# Patient Record
Sex: Female | Born: 1960 | Hispanic: No | Marital: Married | Smoking: Never smoker
Health system: Southern US, Community
[De-identification: ages and names within clinical notes are randomized; demographics above are authoritative.]

## PROBLEM LIST (undated history)

## (undated) DIAGNOSIS — N939 Abnormal uterine and vaginal bleeding, unspecified: Secondary | ICD-10-CM

## (undated) DIAGNOSIS — Z789 Other specified health status: Secondary | ICD-10-CM

## (undated) DIAGNOSIS — N814 Uterovaginal prolapse, unspecified: Secondary | ICD-10-CM

## (undated) DIAGNOSIS — E785 Hyperlipidemia, unspecified: Secondary | ICD-10-CM

## (undated) HISTORY — DX: Uterovaginal prolapse, unspecified: N81.4

## (undated) HISTORY — DX: Hyperlipidemia, unspecified: E78.5

## (undated) HISTORY — DX: Abnormal uterine and vaginal bleeding, unspecified: N93.9

## (undated) HISTORY — PX: NO PAST SURGERIES: SHX2092

---

## 1995-12-24 HISTORY — PX: TUBAL LIGATION: SHX77

## 2008-12-22 ENCOUNTER — Other Ambulatory Visit: Admission: RE | Admit: 2008-12-22 | Discharge: 2008-12-22 | Payer: Self-pay | Admitting: Gynecology

## 2008-12-22 ENCOUNTER — Ambulatory Visit: Payer: Self-pay | Admitting: Gynecology

## 2008-12-22 ENCOUNTER — Encounter: Payer: Self-pay | Admitting: Gynecology

## 2009-01-01 ENCOUNTER — Ambulatory Visit (HOSPITAL_COMMUNITY): Admission: RE | Admit: 2009-01-01 | Discharge: 2009-01-01 | Payer: Self-pay | Admitting: Gynecology

## 2009-01-11 ENCOUNTER — Encounter: Admission: RE | Admit: 2009-01-11 | Discharge: 2009-01-11 | Payer: Self-pay | Admitting: Gynecology

## 2011-08-01 ENCOUNTER — Emergency Department (HOSPITAL_COMMUNITY)
Admission: EM | Admit: 2011-08-01 | Discharge: 2011-08-01 | Disposition: A | Discharge status: Home / Self Care | Payer: Self-pay | Attending: Emergency Medicine | Admitting: Emergency Medicine

## 2011-08-01 DIAGNOSIS — N949 Unspecified condition associated with female genital organs and menstrual cycle: Secondary | ICD-10-CM | POA: Insufficient documentation

## 2011-08-01 DIAGNOSIS — N814 Uterovaginal prolapse, unspecified: Secondary | ICD-10-CM | POA: Insufficient documentation

## 2011-08-01 LAB — URINALYSIS, ROUTINE W REFLEX MICROSCOPIC
Glucose, UA: NEGATIVE mg/dL
Hgb urine dipstick: NEGATIVE
Leukocytes, UA: NEGATIVE
Protein, ur: NEGATIVE mg/dL
Specific Gravity, Urine: 1.008 (ref 1.005–1.030)
pH: 6.5 (ref 5.0–8.0)

## 2011-11-11 ENCOUNTER — Encounter: Payer: Self-pay | Admitting: Obstetrics and Gynecology

## 2015-03-03 ENCOUNTER — Encounter (HOSPITAL_COMMUNITY): Payer: Self-pay | Admitting: *Deleted

## 2015-03-03 ENCOUNTER — Inpatient Hospital Stay (HOSPITAL_COMMUNITY)
Admission: AD | Admit: 2015-03-03 | Discharge: 2015-03-03 | Disposition: A | Discharge status: Home / Self Care | Payer: BLUE CROSS/BLUE SHIELD | Source: Ambulatory Visit | Attending: Family Medicine | Admitting: Family Medicine

## 2015-03-03 ENCOUNTER — Inpatient Hospital Stay (HOSPITAL_COMMUNITY): Payer: BLUE CROSS/BLUE SHIELD

## 2015-03-03 DIAGNOSIS — N814 Uterovaginal prolapse, unspecified: Secondary | ICD-10-CM | POA: Diagnosis not present

## 2015-03-03 DIAGNOSIS — N854 Malposition of uterus: Secondary | ICD-10-CM | POA: Insufficient documentation

## 2015-03-03 DIAGNOSIS — N939 Abnormal uterine and vaginal bleeding, unspecified: Secondary | ICD-10-CM | POA: Diagnosis present

## 2015-03-03 DIAGNOSIS — N819 Female genital prolapse, unspecified: Secondary | ICD-10-CM

## 2015-03-03 HISTORY — DX: Other specified health status: Z78.9

## 2015-03-03 LAB — POCT PREGNANCY, URINE: Preg Test, Ur: NEGATIVE

## 2015-03-03 LAB — CBC
HCT: 41.1 % (ref 36.0–46.0)
Hemoglobin: 13.8 g/dL (ref 12.0–15.0)
MCH: 28.8 pg (ref 26.0–34.0)
MCHC: 33.6 g/dL (ref 30.0–36.0)
MCV: 85.6 fL (ref 78.0–100.0)
Platelets: 328 10*3/uL (ref 150–400)
RBC: 4.8 MIL/uL (ref 3.87–5.11)
RDW: 13.9 % (ref 11.5–15.5)
WBC: 8.4 10*3/uL (ref 4.0–10.5)

## 2015-03-03 LAB — WET PREP, GENITAL
Clue Cells Wet Prep HPF POC: NONE SEEN
Trich, Wet Prep: NONE SEEN
Yeast Wet Prep HPF POC: NONE SEEN

## 2015-03-03 NOTE — MAU Note (Signed)
Urine in lab 

## 2015-03-03 NOTE — MAU Note (Signed)
Pt was told 2 years ago that her uterus had "dropped" & was putting pressure on her bladder.  Has not had a period in over a year.  Has been bleeding for the past six months, came to hospital today because it has a foul odor.  Changes a pad at least three times a day.  Denies pain.

## 2015-03-03 NOTE — Discharge Instructions (Signed)
Colpocleisis (Colpocleisis) La colpocleisis (colpectoma) es un procedimiento quirrgico para eliminar de manera parcial o completa, y cerrar (suturar) la vagina, incluyendo su abertura. Una razn para la ciruga es ayudar a tratar los problemas causados por el prolapso (cada) de uno o ms rganos. El prolapso puede incluir el tero, la vejiga o el recto y generalmente se produce al dar a luz un beb, por realizar grandes esfuerzos o levantar objetos pesados durante largos perodos de Camargotiempo, por ciruga plvica anterior, obesidad, estreimiento crnico y por envejecimiento. Se puede realizar la colpocleisis en mujeres que:  Han dejado de [place] Springsmenstruar.  Le han extirpado el tero (histerectoma).  No desean Management consultanttener relaciones sexuales.  Tienen problemas mdicos que podran hacer que una ciruga ms avanzada fuera Avalonmuy riesgosa. INFORME A SU MDICO:   Cualquier alergia que tenga.  Todos los Chesapeake Energymedicamentos que Huntleyutiliza, incluyendo vitaminas, hierbas, gotas oftlmicas, cremas y [address] Stmedicamentos de [address]venta libre.  Problemas previos que usted o los Graybar Electricmiembros de su familia hayan tenido con el uso de anestsicos.  Enfermedades de [employer] Communicationsla sangre.  Cirugas previas.  Padecimientos mdicos.  Si tuvo resfros o infecciones recientes. RIESGOS Y COMPLICACIONES  Generalmente es un procedimiento seguro. Sin embargo, Tree surgeoncomo en cualquier procedimiento, pueden surgir complicaciones. Las complicaciones posibles son:  IT consultantLesiones en los rganos plvicos circundantes.  Hemorragias.  Infeccin.  Cogulos sanguneos en las piernas o los pulmones.  Problemas con la anestesia. ANTES DEL PROCEDIMIENTO   Consulte a su mdico si debe cambiar o suspender los medicamentos que toma habitualmente.  No debe comer ni beber nada durante las 6 - 8 horas previas al examen. PROCEDIMIENTO   Se le colocarn un catter intravenoso en una vena. Podrn administrarle uno de los siguientes medicamentos:  Medicamentos que adormecen el rea  (anestesia local).  Un medicamento que la har dormir (anestesia general).  Un medicamento inyectado en la columna que adormece el cuerpo de la cintura hacia abajo (anestesia espinal).  Los rganos que protruden vuelven a ubicarse en su posicin normal.  Se elimina la parte superior de la vagina a travs de la abertura de la vagina.  La abertura de la vagina se cierra utilizando suturas absorbibles. Se disolvern en 1-2 meses. DESPUS DEL PROCEDIMIENTO   La llevarn a una sala de recuperacin hasta que la presin arterial, pulso, respiracin y temperatura (signos vitales) estn bien. Luego la trasladarn a una habitacin comn en el hospital.  Duanne Moronodava tendr colocada una va intravenosa en la vena durante 2 das. Tambin tendr un catter en la vejiga durante 2 das.  Le podrn dar antibiticos para prevenir infecciones.  Le indicarn un analgsico. Document Released: 09/02/2012 Document Revised: 08/11/2013 [facility] Patient Information 2015 [place]sserExitCare, [place]. This information is not intended to replace advice given to you by your health care provider. Make sure you discuss any questions you have with your health care provider.

## 2015-03-03 NOTE — MAU Provider Note (Signed)
History     CSN: [id number]  Arrival date and time: 03/03/15 1108   First Provider Initiated Contact with Patient 03/03/15 1217      Chief Complaint  Patient presents with  . Vaginal Bleeding   HPI  54 y.o. G3P3 presents to the MAU today with complaints of shewas told 2 years ago that her uterus had "dropped" & was putting pressure on her bladder. Has not had a period in over a year. Has been bleeding for the past six months, came to hospital today because it has a foul odor.[facility] and was told to be seen at the clinic for further evaluation. She has never followed up. She does have an appointment scheduled April 08, 2015 at the clinic. Changes a pad at least three times a day. Denies pain.  Past Medical History  Diagnosis Date  . Medical history non-contributory     Past Surgical History  Procedure Laterality Date  . No past surgeries      History reviewed. No pertinent family history.  History  Substance Use Topics  . Smoking status: Never Smoker   . Smokeless tobacco: Not on file  . Alcohol Use: No    Allergies: No Known Allergies  No prescriptions prior to admission    Review of Systems  Constitutional: Negative for fever, chills and weight loss.  Eyes: Negative.   Respiratory: Negative for cough and shortness of breath.   Cardiovascular: Negative for chest pain.  Gastrointestinal: Negative for heartburn, nausea, vomiting, abdominal pain, diarrhea and constipation.  Genitourinary: Negative.        Vaginal bleeding for 6 months  Musculoskeletal: Negative.   Skin: Negative for itching and rash.  Neurological: Negative.  Negative for headaches.  Endo/Heme/Allergies: Negative.   Psychiatric/Behavioral: Negative.    Physical Exam   Blood pressure 148/75, pulse 64, temperature 97.6 F (36.4 C), temperature source Oral, resp. rate 18.  Physical Exam  Constitutional: She is oriented to person, place, and time. She appears  well-developed and well-nourished.  HENT:  Head: Normocephalic and atraumatic.  Neck: Normal range of motion.  Cardiovascular: Normal rate.   Respiratory: Effort normal. No respiratory distress.  GI: Soft. She exhibits no distension and no mass. There is no tenderness. There is no rebound and no guarding.  Genitourinary:  Prolapsed uterus with unidentifiable cervix.  Musculoskeletal: Normal range of motion.  Neurological: She is alert and oriented to person, place, and time.  Psychiatric: She has a normal mood and affect. Her behavior is normal. Judgment and thought content normal.    MAU Course  Procedures  MDM Pelvic Exam UA Ultrasound Wet Prep GC/Chlamydia Results for orders placed or performed during the hospital encounter of 03/03/15 (from the past 24 hour(s))  Pregnancy, urine POC     Status: None   Collection Time: 03/03/15 11:51 AM  Result Value Ref Range   Preg Test, Ur NEGATIVE NEGATIVE  Wet prep, genital     Status: Abnormal   Collection Time: 03/03/15 12:50 PM  Result Value Ref Range   Yeast Wet Prep HPF POC NONE SEEN NONE SEEN   Trich, Wet Prep NONE SEEN NONE SEEN   Clue Cells Wet Prep HPF POC NONE SEEN NONE SEEN   WBC, Wet Prep HPF POC MODERATE (A) NONE SEEN  CBC     Status: None   Collection Time: 03/03/15 12:55 PM  Result Value Ref Range   WBC 8.4 4.0 - 10.5 K/uL   RBC 4.80  3.87 - 5.11 MIL/uL   Hemoglobin 13.8 12.0 - 15.0 g/dL   HCT 40.9 81.1 - 91.4 %   MCV 85.6 78.0 - 100.0 fL   MCH 28.8 26.0 - 34.0 pg   MCHC 33.6 30.0 - 36.0 g/dL   RDW 78.2 95.6 - 21.3 %   Platelets 328 150 - 400 K/uL   US Transvaginal Non-ob  03/03/2015   CLINICAL DATA:  Vaginal bleeding.  Prolapsed uterus.  EXAM: TRANSABDOMINAL AND TRANSVAGINAL ULTRASOUND OF PELVIS  TECHNIQUE: Both transabdominal and transvaginal ultrasound examinations of the pelvis were performed. Transabdominal technique was performed for global imaging of the pelvis including uterus, ovaries, adnexal regions,  and pelvic cul-de-sac. It was necessary to proceed with endovaginal exam following the transabdominal exam to visualize the uterus and ovaries.  COMPARISON:  None  FINDINGS: Uterus  Measurements: 5.7 x 2.9 x 3.6 cm. No mass lesion identified. The uterus is difficult to evaluate due to prolapse and retroversion of the uterus.  Endometrium  Thickness: Small amount of fluid in the uterine cavity. No endometrial mass. Endometrium in the uterus not optimally visualized due to anatomic considerations and prolapse. No focal abnormality visualized.  Right ovary  Measurements: . Not visualized  Left ovary  Measurements: . Not visualized.  Other findings  No free fluid.  IMPRESSION: Technically difficult scan due to prolapse of the uterus and body habitus.  No uterine mass lesion.  No endometrial mass  Ovaries not visualized bilaterally.   Electronically Signed   By: Marlan Palau M.D.   On: 03/03/2015 14:23   US Pelvis Complete  03/03/2015   CLINICAL DATA:  Vaginal bleeding.  Prolapsed uterus.  EXAM: TRANSABDOMINAL AND TRANSVAGINAL ULTRASOUND OF PELVIS  TECHNIQUE: Both transabdominal and transvaginal ultrasound examinations of the pelvis were performed. Transabdominal technique was performed for global imaging of the pelvis including uterus, ovaries, adnexal regions, and pelvic cul-de-sac. It was necessary to proceed with endovaginal exam following the transabdominal exam to visualize the uterus and ovaries.  COMPARISON:  None  FINDINGS: Uterus  Measurements: 5.7 x 2.9 x 3.6 cm. No mass lesion identified. The uterus is difficult to evaluate due to prolapse and retroversion of the uterus.  Endometrium  Thickness: Small amount of fluid in the uterine cavity. No endometrial mass. Endometrium in the uterus not optimally visualized due to anatomic considerations and prolapse. No focal abnormality visualized.  Right ovary  Measurements: . Not visualized  Left ovary  Measurements: . Not visualized.  Other findings  No free  fluid.  IMPRESSION: Technically difficult scan due to prolapse of the uterus and body habitus.  No uterine mass lesion.  No endometrial mass  Ovaries not visualized bilaterally.   Electronically Signed   By: Marlan Palau M.D.   On: 03/03/2015 14:23    Assessment and Plan  Uterine Prolapse  Follow up with Scheduled appointment at [facility] on April 08, 2015 Discharge to home   [facility] 03/03/2015, 12:33 PM

## 2015-03-04 LAB — HIV ANTIBODY (ROUTINE TESTING W REFLEX): HIV Screen 4th Generation wRfx: NONREACTIVE

## 2015-03-06 LAB — GC/CHLAMYDIA PROBE AMP (~~LOC~~) NOT AT ARMC
Chlamydia: NEGATIVE
Neisseria Gonorrhea: NEGATIVE

## 2015-04-05 ENCOUNTER — Ambulatory Visit (INDEPENDENT_AMBULATORY_CARE_PROVIDER_SITE_OTHER): Payer: BLUE CROSS/BLUE SHIELD | Admitting: Obstetrics & Gynecology

## 2015-04-05 ENCOUNTER — Encounter: Payer: Self-pay | Admitting: Obstetrics & Gynecology

## 2015-04-05 VITALS — BP 134/53 | HR 69 | Temp 98.2°F | Resp 20 | Ht 63.0 in | Wt 135.1 lb

## 2015-04-05 DIAGNOSIS — N950 Postmenopausal bleeding: Secondary | ICD-10-CM | POA: Insufficient documentation

## 2015-04-05 NOTE — Progress Notes (Signed)
Pt states she received pessary 3 years ago for prolapsed uterus. She was not instructed to remove it periodically for cleaning. She began having vaginal bleeding 6 months ago.

## 2015-04-05 NOTE — Assessment & Plan Note (Signed)
Pertinent S&O  Vaginal bleeding x 6 months s/p menopause ~ 4 years ago  Pessary left in place x 3 years w/o removal - removed in clinic  Posterior vaginal walls very erythematous and macerated   No additional medical problems  No Fhx of CA; No constitutional symptoms concerning for CA Assessment  Bleeding likely due to Vaginal trauma due to prolonged pessary use; However can't ruleout other vaginal pathology or endometrial CA given current condition of vaginal mucosal lining  Plan  Removed Pessary in clinic  Allow 2 weeks for vaginal healing to occur; f/u in clinic in 2 weeks for further evaluation  Consider endometrial biopsy at next visit  Asked patient to clean pessary in bleach water and bring to next visit; DON'T replace pessary until re-evaluation

## 2015-04-05 NOTE — Progress Notes (Signed)
Subjective:     Patient ID: Becky Petty, female   DOB: [birth year], 54 y.o.   MRN: [id number]  HPI Comments: Comes in today for post-menopausal vaginal bleeding x 6 months and vaginal odor. She reports Going through menopause ~ 4 years ago. She started having periods ~ age 54 and they were regular until menopause. She reports having 3 children via Csection and had BTL. She also reports having pessary placed ~ 3 years ago, and has not removed it since. She denies any vaginal pain. Denies Fhx of breast, colon, endometrial or ovarian CA. She has no other medical problems and doesn't take any medications. Denies fevers, chills, night sweats, or weight loss.     Review of Systems  Constitutional: Negative for fever, chills, appetite change and unexpected weight change.  Gastrointestinal: Negative for abdominal pain, constipation and abdominal distention.  Genitourinary: Positive for vaginal bleeding. Negative for vaginal discharge and vaginal pain.      Objective:   Physical Exam  Constitutional: She appears well-developed and well-nourished. No distress.  Cardiovascular: Normal rate, regular rhythm and normal heart sounds.   No murmur heard. Pulmonary/Chest: Effort normal and breath sounds normal. No respiratory distress.  Abdominal: Soft. She exhibits no distension. There is no tenderness.  Genitourinary:  Pessary removed. Posterior vaginal walls extremely erythematous and macerated.    Assessment/Plan:      See Problem Focused Assessment & Plan

## 2015-04-07 ENCOUNTER — Encounter: Payer: Self-pay | Admitting: Obstetrics & Gynecology

## 2015-04-07 NOTE — Progress Notes (Signed)
Patient ID: Becky GoltzOlga Petty, female   DOB: 05/28/1961, 54 y.o.   MRN: [id number] Attestation of Attending Supervision of Resident: Evaluation and management procedures were performed [facility] the [facility] under my supervision.  I have seen and examined the patient, reviewed the resident's note and chart, and I agree with the management and plan.  Pt with pessary for 3 year with no removal. Pt now c/o bleeding and odor.  Exam: pessay was removed without difficulty.  The exam revealed a macerated vagina with bleeding and odor.   It was difficult to appreciate the tissue planes due to edema, erythema and blood.  I have recommended that pt f/u after 2 weeks to allow the swelling to resolve.  I have no evidence of infection so will hold atbx for now and allow time for the tissue to heal spontaneously.     Anibal Hendersonarolyn L Harraway-Smith, M.D. 04/07/2015 10:05 AM

## 2015-04-26 ENCOUNTER — Ambulatory Visit: Payer: Self-pay | Admitting: Obstetrics & Gynecology

## 2015-05-10 ENCOUNTER — Encounter: Payer: Self-pay | Admitting: Obstetrics & Gynecology

## 2015-05-10 ENCOUNTER — Other Ambulatory Visit (HOSPITAL_COMMUNITY)
Admission: RE | Admit: 2015-05-10 | Discharge: 2015-05-10 | Disposition: A | Discharge status: Home / Self Care | Payer: BLUE CROSS/BLUE SHIELD | Source: Ambulatory Visit | Attending: Obstetrics & Gynecology | Admitting: Obstetrics & Gynecology

## 2015-05-10 ENCOUNTER — Ambulatory Visit (INDEPENDENT_AMBULATORY_CARE_PROVIDER_SITE_OTHER): Payer: BLUE CROSS/BLUE SHIELD | Admitting: Obstetrics & Gynecology

## 2015-05-10 VITALS — BP 118/97 | HR 80 | Temp 98.3°F | Wt 134.2 lb

## 2015-05-10 DIAGNOSIS — N950 Postmenopausal bleeding: Secondary | ICD-10-CM | POA: Diagnosis not present

## 2015-05-10 DIAGNOSIS — N841 Polyp of cervix uteri: Secondary | ICD-10-CM | POA: Diagnosis not present

## 2015-05-10 DIAGNOSIS — Z1151 Encounter for screening for human papillomavirus (HPV): Secondary | ICD-10-CM | POA: Diagnosis not present

## 2015-05-10 DIAGNOSIS — Z124 Encounter for screening for malignant neoplasm of cervix: Secondary | ICD-10-CM | POA: Diagnosis not present

## 2015-05-10 NOTE — Progress Notes (Signed)
Subjective:     Patient ID: Becky GoltzOlga Sarchet, female   DOB: [birth year], 54 y.o.   MRN: [id number]  HPI Pt was seen 1 month prev for 'post menopausal bleeding'.  At the time she had a pessary removed that had been in place for 3 years with no removal.  Her vagina was completely macerated and an exam was limited due to the extreme amount of edema.   She is here for reeval.  Pt reports that 2 weeks after she was seen her she had a 'gush of blood' while cleaning.    She reports 2 episodes of bleeding but her husband thinks its more.  The pt says that it is now just spotting.   Review of Systems     Objective:   Physical Exam BP 118/97 mmHg  Pulse 80  Temp(Src) 98.3 F (36.8 C)  Wt 134 lb 3.2 oz (60.873 kg)  LMP  (LMP Unknown) Pt in NAD  The indications for endometrial biopsy were reviewed.   Risks of the biopsy including cramping, bleeding, infection, uterine perforation, inadequate specimen and need for additional procedures  were discussed. The patient states she understands and agrees to undergo procedure today. Consent was signed. Time out was performed. Urine HCG was negative. The pt has a complete procidentia.  The cervix was prepped with Betadine. A polyp was noted at 3 o'clock.  It was removed with Ringed forceps. The 3 mm pipelle was introduced into the endometrial cavity without difficulty to a depth of 9cm, and a moderate amount of tissue was obtained and sent to pathology. The instruments were removed from the patient's vagina. Minimal bleeding from the cervix was noted. The patient tolerated the procedure well.       Assessment:     PMPB- unsure of etiology of the bleeding at present cold be from the pessary Cervical polyp- removed Complete procidentia vaginal walls now healed but, will wait for path prior to replacing pessary       Plan:     Routine post-procedure instructions were given to the patient. The patient will follow up to review the results and for further management.    F/u Endo biopsy F/u cervical polyp path F/u in 2 weeks will review path and replace pessary at that time

## 2015-05-10 NOTE — Addendum Note (Signed)
Addended by: Louanna RawAMPBELL, Olander Friedl M on: 05/10/2015 03:46 PM   Modules accepted: Orders

## 2015-05-10 NOTE — Patient Instructions (Signed)
Biopsia de endometrio - Cuidados posteriores (Endometrial Biopsy, Care After) Siga estas instrucciones durante las prximas semanas. Estas indicaciones le proporcionan informacin general acerca de cmo deber cuidarse despus del procedimiento. El mdico tambin podr darle instrucciones ms especficas. El tratamiento se ha planificado de acuerdo a las prcticas mdicas actuales, pero a veces se producen problemas. Comunquese con el mdico si tiene algn problema o tiene dudas despus del procedimiento. QU ESPERAR DESPUS DEL PROCEDIMIENTO Despus del procedimiento, es tpico tener las siguientes sensaciones:  Sentir clicos leves y tendr una pequea cantidad de sangrado vaginal durante algunos das despus del procedimiento. Esto es normal. INSTRUCCIONES PARA EL CUIDADO EN EL HOGAR  Tome slo medicamentos de venta libre o recetados, segn las indicaciones del mdico.  No utilice tampones, duchas vaginales ni tenga relaciones sexuales hasta que el profesional la autorice.  Siga las indicaciones del mdico relacionadas con la restriccin a ciertas actividades, como ejercicios fsicos intensos o levantar objetos pesados. SOLICITE ATENCIN MDICA SI:  Tiene un sangrado abundante o sangra durante ms de 2 das despus del procedimiento.  Advierte un olor ftido que proviene de la vagina.  Siente escalofros o tiene fiebre.  Siente un dolor en el bajo vientre (abdominal) muy intenso. SOLICITE ATENCIN MDICA DE INMEDIATO SI:  Siente clicos intensos en el estmago o en la espalda.  Elimina cogulos grandes.  La hemorragia aumenta.  Se siente mareada, dbil, o se desmaya. Document Released: 09/29/2013 ExitCare Patient Information 2015 ExitCare, LLC. This information is not intended to replace advice given to you by your health care provider. Make sure you discuss any questions you have with your health care provider.  

## 2015-05-11 LAB — CYTOLOGY - PAP

## 2015-05-15 ENCOUNTER — Telehealth: Payer: Self-pay | Admitting: *Deleted

## 2015-05-15 NOTE — Telephone Encounter (Signed)
-----   Message from Willodean Rosenthalarolyn Harraway-Smith, MD sent at 05/13/2015  4:25 PM EDT ----- Please call pt.  Her endo bx and polyp were both WITHOUT cancer.  She can com ein and have her pessary replaced.  Need spanish interpreter.  Thx, clh-S

## 2015-05-15 NOTE — Telephone Encounter (Signed)
Attempted to contact patient, no answer, unable to leave a message.

## 2015-05-16 NOTE — Telephone Encounter (Signed)
Called Becky Petty with [employer]pre[name], heard message voicemail not set up. Unable to leave message.

## 2015-05-17 ENCOUNTER — Encounter: Payer: Self-pay | Admitting: *Deleted

## 2015-05-17 NOTE — Telephone Encounter (Signed)
[facility], no answer and voicemail has not been set up. Called patient at emergency contact and phone number isn't a working number. Called patient's son, no answer- Left message asking to have patient call us back at the clinics.

## 2015-05-17 NOTE — Telephone Encounter (Signed)
Will send letter.  Certified letter sent.

## 2015-06-02 ENCOUNTER — Ambulatory Visit: Payer: Self-pay | Admitting: Obstetrics & Gynecology

## 2015-06-06 ENCOUNTER — Encounter: Payer: Self-pay | Admitting: General Practice

## 2015-06-07 ENCOUNTER — Encounter: Payer: Self-pay | Admitting: Obstetrics & Gynecology

## 2015-06-07 ENCOUNTER — Ambulatory Visit (INDEPENDENT_AMBULATORY_CARE_PROVIDER_SITE_OTHER): Payer: BLUE CROSS/BLUE SHIELD | Admitting: Obstetrics & Gynecology

## 2015-06-07 VITALS — BP 139/68 | HR 96 | Temp 98.4°F | Wt 134.4 lb

## 2015-06-07 DIAGNOSIS — N813 Complete uterovaginal prolapse: Secondary | ICD-10-CM | POA: Insufficient documentation

## 2015-06-07 NOTE — Progress Notes (Signed)
   Subjective:    Patient ID: Becky Petty, female    DOB: 02-25-1961, 54 y.o.   MRN: [id number]  HPI 54 yo H P3 here for a pessary fitting. She used a large ring with diaphragm for 3 years without removing it at all. She was seen here for PMB and a macerated vaginal was noted. Her pap and EMBX were normal. She tells me that no one ever told her that she should ever remove it.  Review of Systems     Objective:   Physical Exam Complete procedentia noted. Intact vaginal mucosa I placed a #4 pessary (2 3/4 inches) and this worked as well as the larger one. She was able to remove and replace it.      Assessment & Plan:  Complete procedentia- #4 pessary with strict instructions to leave it out every Sunday night. She will have a consult with Dr. Lavella Hammock for possible surgery

## 2015-06-07 NOTE — Progress Notes (Signed)
Referral made to Dr. Lavella Hammock. Appointment scheduled for July 27 at 1300. Patient given appointment, address and phone number. Records to be faxed by front office.

## 2015-09-06 ENCOUNTER — Encounter: Payer: Self-pay | Admitting: Obstetrics & Gynecology

## 2016-12-08 IMAGING — US US TRANSVAGINAL NON-OB
1 series · 14 of 25 positions shown · non-contrast
Comparison: None

CLINICAL DATA: Vaginal bleeding.  Prolapsed uterus.

EXAM:
TRANSABDOMINAL AND TRANSVAGINAL ULTRASOUND OF PELVIS
TECHNIQUE: Both transabdominal and transvaginal ultrasound examinations of the
pelvis were performed. Transabdominal technique was performed for
global imaging of the pelvis including uterus, ovaries, adnexal
regions, and pelvic cul-de-sac. It was necessary to proceed with
endovaginal exam following the transabdominal exam to visualize the
uterus and ovaries.

[Series 1: us pelvis complete · 14 of 43 slices shown]
[im 1/43]
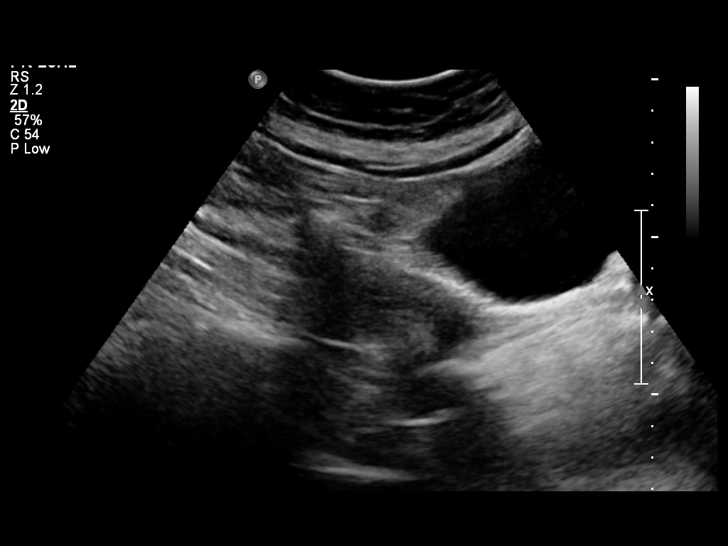
[im 4/43]
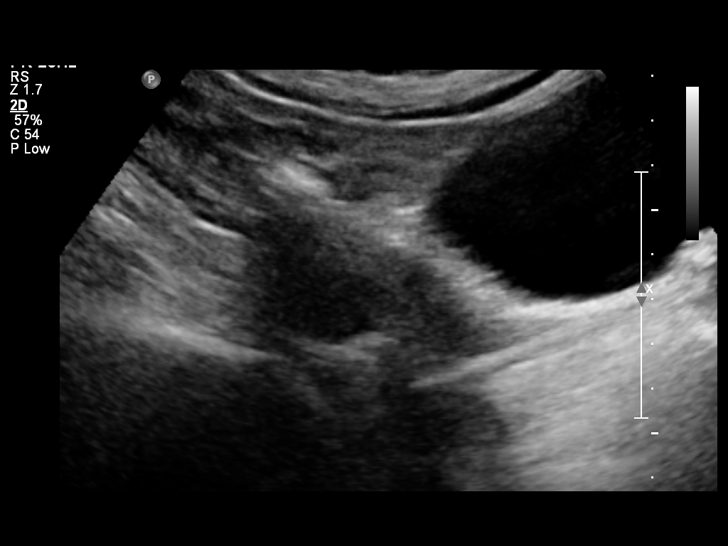
[im 8/43]
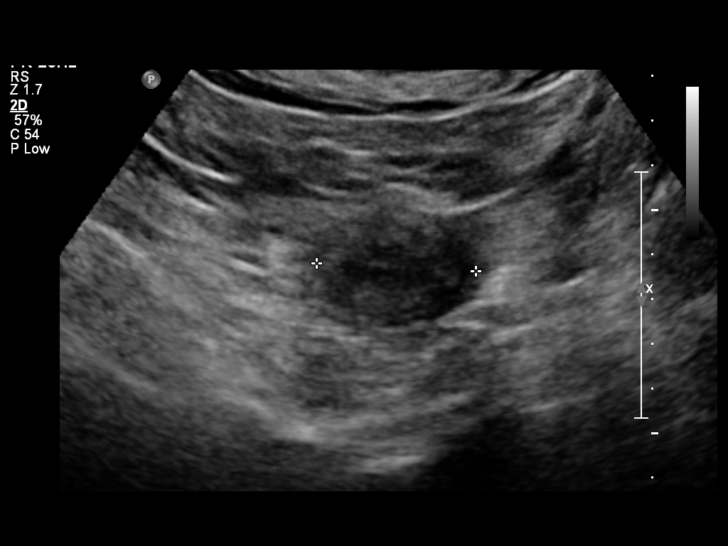
[im 11/43]
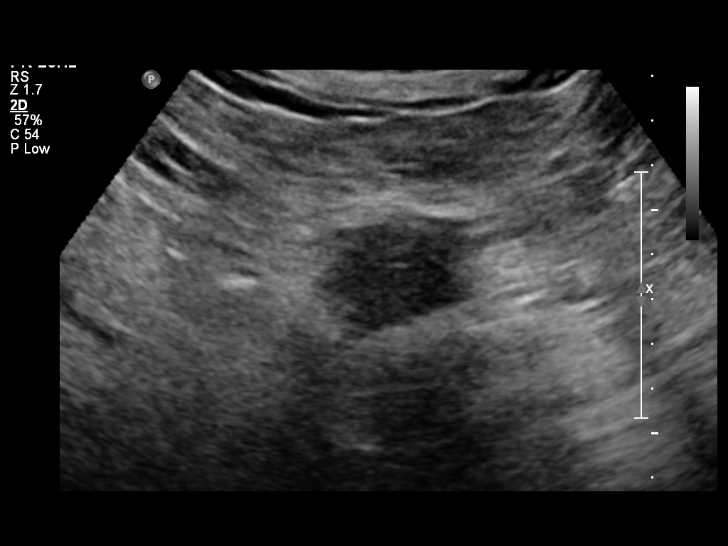
[im 15/43]
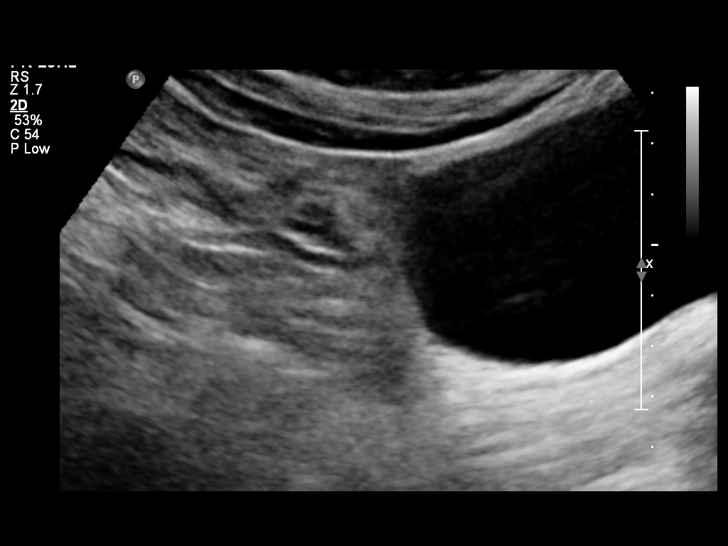
[im 16/43]
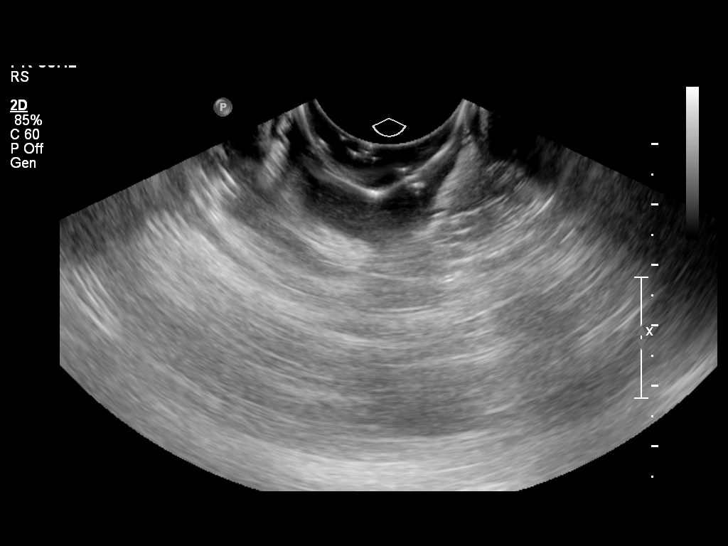
[im 20/43]
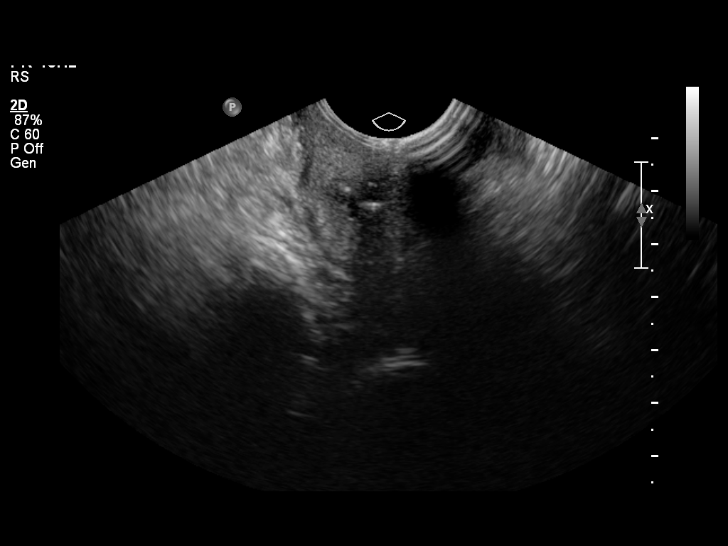
[im 23/43]
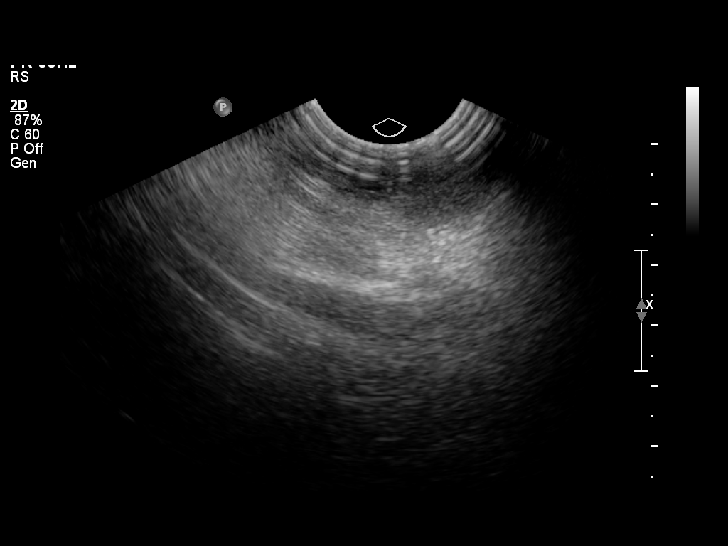
[im 27/43]
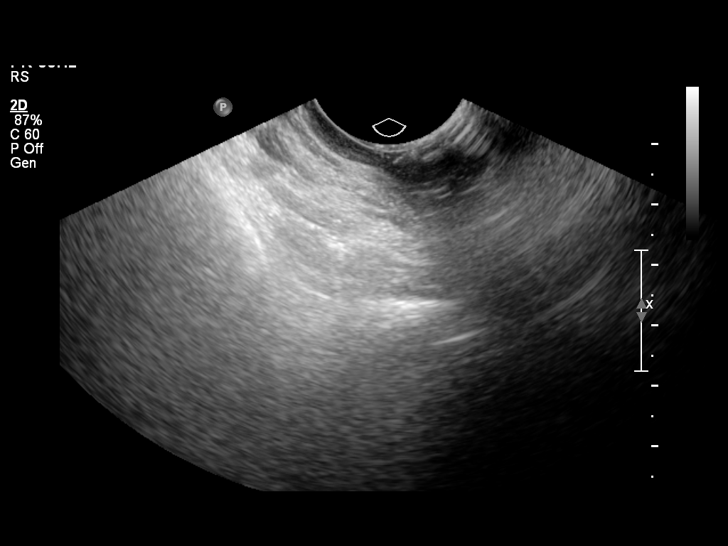
[im 29/43]
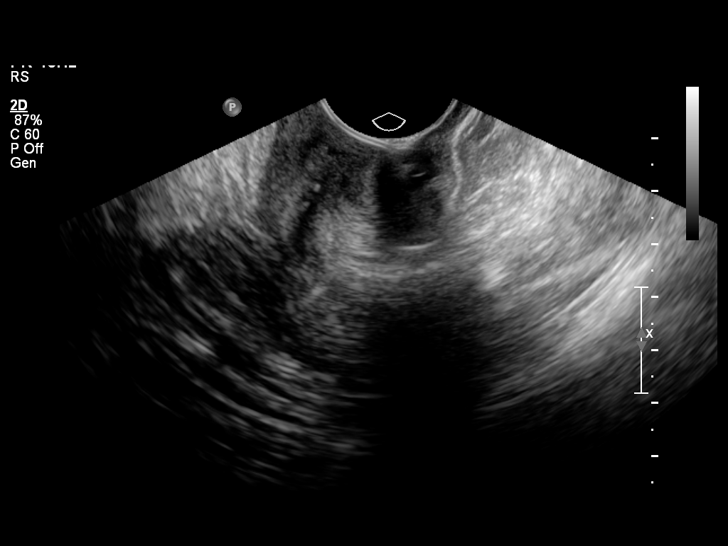
[im 32/43]
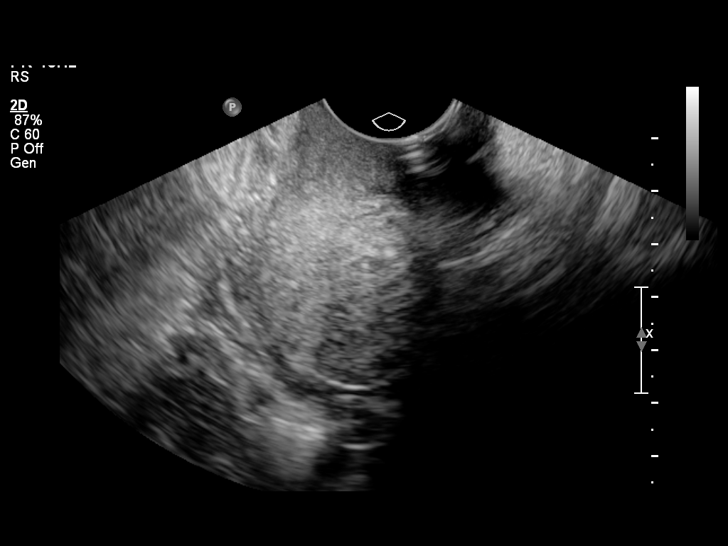
[im 36/43]
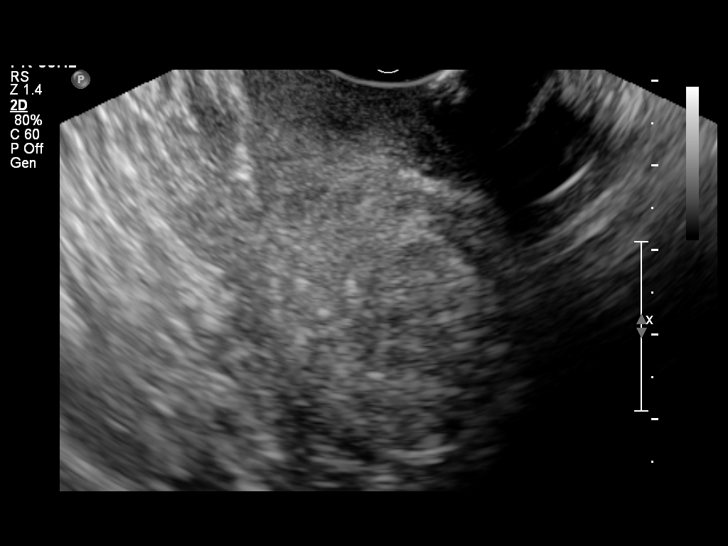
[im 39/43]
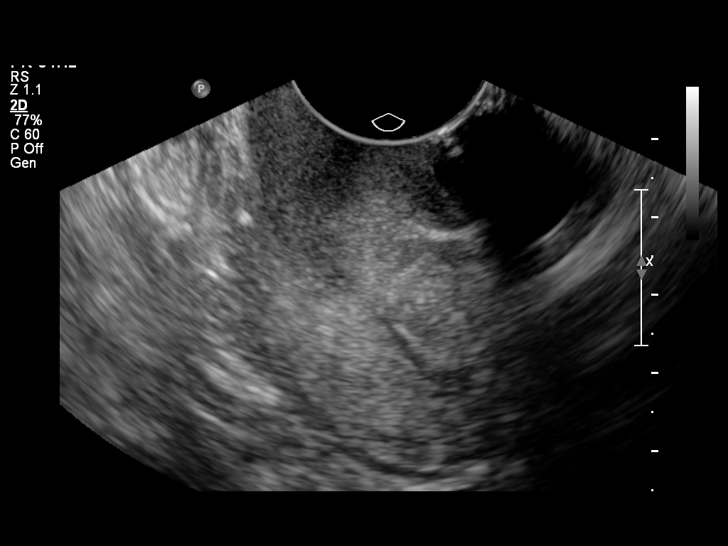
[im 43/43]
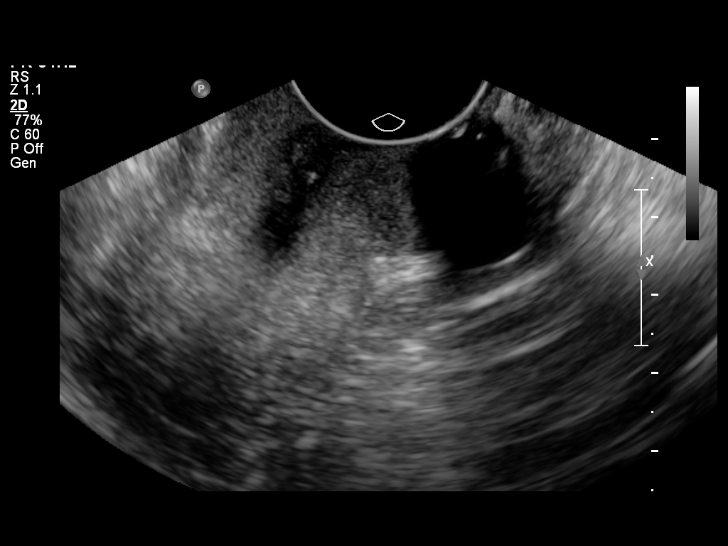

[14 of 25 positions shown; findings below may reference images not displayed]

FINDINGS: Uterus

Measurements: 5.7 x 2.9 x 3.6 cm.. No mass lesion identified. The
uterus is difficult to evaluate due to prolapse and retroversion of
the uterus.

Endometrium

Thickness: Small amount of fluid in the uterine cavity. No
endometrial mass. Endometrium in the uterus not optimally visualized
due to anatomic considerations and prolapse.. No focal abnormality
visualized.

Right ovary

Measurements: . Not visualized

Left ovary

Measurements: . Not visualized.

Other findings

No free fluid.
IMPRESSION: Technically difficult scan due to prolapse of the uterus and body
habitus.

No uterine mass lesion.  No endometrial mass

Ovaries not visualized bilaterally.

## 2020-05-11 ENCOUNTER — Ambulatory Visit (INDEPENDENT_AMBULATORY_CARE_PROVIDER_SITE_OTHER): Payer: Self-pay | Admitting: Podiatry

## 2020-05-11 DIAGNOSIS — Z5329 Procedure and treatment not carried out because of patient's decision for other reasons: Secondary | ICD-10-CM

## 2020-05-11 NOTE — Progress Notes (Signed)
No show for appt. 

## 2024-11-23 ENCOUNTER — Other Ambulatory Visit: Payer: Self-pay

## 2024-11-23 ENCOUNTER — Emergency Department (HOSPITAL_COMMUNITY)
Admission: EM | Admit: 2024-11-23 | Discharge: 2024-11-23 | Disposition: A | Discharge status: Home / Self Care | Attending: Emergency Medicine | Admitting: Emergency Medicine

## 2024-11-23 ENCOUNTER — Emergency Department (HOSPITAL_COMMUNITY)

## 2024-11-23 ENCOUNTER — Encounter (HOSPITAL_COMMUNITY): Payer: Self-pay

## 2024-11-23 DIAGNOSIS — W07XXXA Fall from chair, initial encounter: Secondary | ICD-10-CM | POA: Insufficient documentation

## 2024-11-23 DIAGNOSIS — W19XXXA Unspecified fall, initial encounter: Secondary | ICD-10-CM

## 2024-11-23 DIAGNOSIS — M545 Low back pain, unspecified: Secondary | ICD-10-CM | POA: Diagnosis not present

## 2024-11-23 DIAGNOSIS — S0001XA Abrasion of scalp, initial encounter: Secondary | ICD-10-CM | POA: Insufficient documentation

## 2024-11-23 DIAGNOSIS — S0990XA Unspecified injury of head, initial encounter: Secondary | ICD-10-CM | POA: Diagnosis present

## 2024-11-23 MED ORDER — NAPROXEN 500 MG PO TABS: 500.0000 mg | ORAL_TABLET | Freq: Two times a day (BID) | ORAL | 0 refills | Status: AC

## 2024-11-23 MED ORDER — TETANUS-DIPHTH-ACELL PERTUSSIS 5-2-15.5 LF-MCG/0.5 IM SUSP
0.5000 mL | Freq: Once | INTRAMUSCULAR | Status: AC
  Administered 2024-11-23: 0.5 mL via INTRAMUSCULAR
  Filled 2024-11-23: qty 0.5

## 2024-11-23 MED ORDER — METHOCARBAMOL 500 MG PO TABS: 500.0000 mg | ORAL_TABLET | Freq: Three times a day (TID) | ORAL | 0 refills | Status: AC | PRN

## 2024-11-23 MED ORDER — ACETAMINOPHEN 500 MG PO TABS
1000.0000 mg | ORAL_TABLET | Freq: Once | ORAL | Status: AC
  Administered 2024-11-23: 1000 mg via ORAL
  Filled 2024-11-23: qty 2

## 2024-11-23 NOTE — ED Notes (Signed)
 Patient transported to CT

## 2024-11-23 NOTE — ED Triage Notes (Signed)
 Pt arrived from home via [facility] s/p fall from chair that she was standing on. Pt landed on left hip 5/10 on pain scale. Pt hit her head on something, not sure. Laceration to back of head 4/10 on pain scale Bleeding currently controlled.

## 2024-11-23 NOTE — Discharge Instructions (Addendum)
 You were evaluated in the Emergency Department and after careful evaluation, we did not find any emergent condition requiring admission or further testing in the hospital.  Your exam/testing today is overall reassuring.  Symptoms may be due to muscular strain or bruising.  Recommend using the Naprosyn twice daily as prescribed for pain.  Can use the Robaxin muscle relaxer for more significant pain, best used at night if you are having trouble sleeping as it can cause drowsiness.  Please return to the Emergency Department if you experience any worsening of your condition.   Thank you for allowing us  to be a part of your care.

## 2024-11-23 NOTE — ED Provider Notes (Addendum)
 [facility] Emergency Department Provider Note MRN:  [id number]  Arrival date & time: 11/23/24     Chief Complaint   Fall   History of Present Illness   Becky Petty is a 63 y.o. year-old female with no pertinent past medical history presenting to the ED with chief complaint of fall.  Patient was reaching for something on top of the refrigerator, tripped and fell.  Endorsing low back pain, head trauma with some bleeding.  No neck pain, no upper back pain, no chest pain or shortness of breath, no abdominal pain, no injuries to the arms or legs.  Some hip pain bilaterally.  Review of Systems  A thorough review of systems was obtained and all systems are negative except as noted in the HPI and PMH.   Patient's Health History    Past Medical History:  Diagnosis Date   Abnormal vaginal bleeding    Hyperlipidemia    Medical history non-contributory    Uterine prolapse     Past Surgical History:  Procedure Laterality Date   NO PAST SURGERIES     TUBAL LIGATION  1997    History reviewed. No pertinent family history.  Social History   Socioeconomic History   Marital status: Married    Spouse name: Not on file   Number of children: Not on file   Years of education: Not on file   Highest education level: Not on file  Occupational History   Not on file  Tobacco Use   Smoking status: Never   Smokeless tobacco: Not on file  Substance and Sexual Activity   Alcohol use: No   Drug use: No   Sexual activity: Not Currently    Birth control/protection: Surgical  Other Topics Concern   Not on file  Social History Narrative   Not on file   Social Drivers of Health   Financial Resource Strain: Not on file  Food Insecurity: Not on file  Transportation Needs: Not on file  Physical Activity: Not on file  Stress: Not on file  Social Connections: Not on file  Intimate Partner Violence: Not on file     Physical Exam   Vitals:   11/23/24 0600 11/23/24  0653  BP: (!) 142/71 131/73  Pulse: 75 75  Resp: 19 15  Temp:    SpO2: 100% 100%    CONSTITUTIONAL: Well-appearing, NAD NEURO/PSYCH:  Alert and oriented x 3, no focal deficits EYES:  eyes equal and reactive ENT/NECK:  no LAD, no JVD CARDIO: Regular rate, well-perfused, normal S1 and S2 PULM:  CTAB no wheezing or rhonchi GI/GU:  non-distended, non-tender MSK/SPINE:  No gross deformities, no edema SKIN:  no rash   *Additional and/or pertinent findings included in MDM below  Diagnostic and Interventional Summary    EKG Interpretation Date/Time:    Ventricular Rate:    PR Interval:    QRS Duration:    QT Interval:    QTC Calculation:   R Axis:      Text Interpretation:         Labs Reviewed - No data to display  CT Head Wo Contrast  Final Result    CT Lumbar Spine Wo Contrast  Final Result    DG Hip Unilat With Pelvis 2-3 Views Left  Final Result      Medications  Tdap (ADACEL) injection 0.5 mL (0.5 mLs Intramuscular Given 11/23/24 0649)  acetaminophen (TYLENOL) tablet 1,000 mg (1,000 mg Oral Given 11/23/24 9379)     Procedures  /  Critical Care Procedures  ED Course and Medical Decision Making  Initial Impression and Ddx Well-appearing in no acute distress, normal vitals.  Pinpoint laceration or abrasion to the occipital scalp not worthy of suture repair.  Hemostatic.  With the head trauma and the midline lumbar spinal tenderness will obtain CT imaging.  Lung sounds clear and present, not having any chest pain shortness of breath, abdomen soft nontender, doubt significant intrathoracic or intra-abdominal injury.  Past medical/surgical history that increases complexity of ED encounter: None  Interpretation of Diagnostics Imaging pending  Patient Reassessment and Ultimate Disposition/Management     CT imaging is overall reassuring.  There is comment on an abnormal finding on T12 with question of compression fracture.  However on my exam patient has no point  tenderness in this location.  Her pain is in the lower lumbar back and is more paraspinal and so favoring that this is artifact rather than true fracture.  Patient has normal and symmetric strength and sensation, no signs or symptoms of myelopathy.  Appropriate for discharge.  Patient management required discussion with the following services or consulting groups:  None  Complexity of Problems Addressed Acute illness or injury that poses threat of life of bodily function  Additional Data Reviewed and Analyzed Further history obtained from: Further history from spouse/family member  Additional Factors Impacting ED Encounter Risk Consideration of hospitalization  Ozell HERO. Theadore, MD [facility] Emergency Medicine [facility] [email or web address]  Final Clinical Impressions(s) / ED Diagnoses     ICD-10-CM   1. Fall, initial encounter  W19.XXXA     2. Abrasion of scalp, initial encounter  S00.01XA     3. Acute midline low back pain without sciatica  M54.50       ED Discharge Orders          Ordered    naproxen  (NAPROSYN ) 500 MG tablet  2 times daily        11/23/24 0713    methocarbamol  (ROBAXIN ) 500 MG tablet  Every 8 hours PRN        11/23/24 9286             Discharge Instructions Discussed with and Provided to Patient:     Discharge Instructions      You were evaluated in the Emergency Department and after careful evaluation, we did not find any emergent condition requiring admission or further testing in the hospital.  Your exam/testing today is overall reassuring.  Symptoms may be due to muscular strain or bruising.  Recommend using the Naprosyn  twice daily as prescribed for pain.  Can use the Robaxin  muscle relaxer for more significant pain, best used at night if you are having trouble sleeping as it can cause drowsiness.  Please return to the Emergency Department if you experience any worsening of your condition.   Thank you for allowing us  to  be a part of your care.       Theadore Ozell HERO, MD 11/23/24 [phone]    Theadore Ozell HERO, MD 11/23/24 [phone]
